# Patient Record
Sex: Female | Born: 1990 | Hispanic: No | Marital: Single | Smoking: Never smoker
Health system: Southern US, Community
[De-identification: ages and names within clinical notes are randomized; demographics above are authoritative.]

## PROBLEM LIST (undated history)

## (undated) DIAGNOSIS — R112 Nausea with vomiting, unspecified: Secondary | ICD-10-CM

## (undated) DIAGNOSIS — Z9889 Other specified postprocedural states: Secondary | ICD-10-CM

## (undated) HISTORY — PX: EYE SURGERY: SHX253

---
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
## 2010-08-18 ENCOUNTER — Encounter: Payer: Self-pay | Admitting: Family Medicine

## 2010-08-18 ENCOUNTER — Ambulatory Visit: Payer: Self-pay | Admitting: Family Medicine

## 2010-08-18 DIAGNOSIS — N760 Acute vaginitis: Secondary | ICD-10-CM | POA: Insufficient documentation

## 2010-08-18 LAB — CONVERTED CEMR LAB
CO2: 24 meq/L (ref 19–32)
Calcium: 9.5 mg/dL (ref 8.4–10.5)
Chloride: 102 meq/L (ref 96–112)
Cholesterol: 154 mg/dL (ref 0–200)
Glucose, Bld: 78 mg/dL (ref 70–99)
Hemoglobin: 13.8 g/dL (ref 12.0–15.0)
LDL Cholesterol: 98 mg/dL (ref 0–99)
MCHC: 34 g/dL (ref 30.0–36.0)
MCV: 89.4 fL (ref 78.0–100.0)
Potassium: 3.9 meq/L (ref 3.5–5.3)
RBC: 4.54 M/uL (ref 3.87–5.11)
Sodium: 137 meq/L (ref 135–145)
Total CHOL/HDL Ratio: 3.3
Triglycerides: 48 mg/dL (ref <150)
VLDL: 10 mg/dL (ref 0–40)
WBC: 6.5 10*3/uL (ref 4.0–10.5)

## 2010-08-19 ENCOUNTER — Encounter: Payer: Self-pay | Admitting: Family Medicine

## 2010-10-09 NOTE — Letter (Signed)
Summary: Generic Letter  [facility]  [address]   [place], [place] [address]   Phone: [phone]  Fax: [phone]    08/19/2010  [facility] [address] [place], [place]  [address]  Dear Ms. Stapel,   All of the blood work done at your Health Visit was normal.  The testing that was done was also all negative.   Keep up with staying healthy, and I will see you in a year unless you need me sooner.     Sincerely,   Luretha Murphy NP  Appended Document: Generic Letter letter mailed
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
## 2010-10-15 ENCOUNTER — Encounter: Payer: Self-pay | Admitting: *Deleted

## 2012-01-11 ENCOUNTER — Ambulatory Visit: Payer: Self-pay | Admitting: Family Medicine

## 2013-03-17 ENCOUNTER — Emergency Department (HOSPITAL_COMMUNITY): Payer: 59

## 2013-03-17 ENCOUNTER — Encounter (HOSPITAL_COMMUNITY): Payer: Self-pay | Admitting: *Deleted

## 2013-03-17 ENCOUNTER — Emergency Department (HOSPITAL_COMMUNITY)
Admission: EM | Admit: 2013-03-17 | Discharge: 2013-03-18 | Disposition: A | Discharge status: Home / Self Care | Payer: 59 | Attending: Emergency Medicine | Admitting: Emergency Medicine

## 2013-03-17 DIAGNOSIS — S0530XA Ocular laceration without prolapse or loss of intraocular tissue, unspecified eye, initial encounter: Secondary | ICD-10-CM | POA: Insufficient documentation

## 2013-03-17 DIAGNOSIS — W34010A Accidental discharge of airgun, initial encounter: Secondary | ICD-10-CM | POA: Insufficient documentation

## 2013-03-17 DIAGNOSIS — H2100 Hyphema, unspecified eye: Secondary | ICD-10-CM | POA: Insufficient documentation

## 2013-03-17 DIAGNOSIS — H5460 Unqualified visual loss, one eye, unspecified: Secondary | ICD-10-CM | POA: Insufficient documentation

## 2013-03-17 DIAGNOSIS — Y929 Unspecified place or not applicable: Secondary | ICD-10-CM | POA: Insufficient documentation

## 2013-03-17 DIAGNOSIS — Z23 Encounter for immunization: Secondary | ICD-10-CM | POA: Insufficient documentation

## 2013-03-17 DIAGNOSIS — H2101 Hyphema, right eye: Secondary | ICD-10-CM

## 2013-03-17 DIAGNOSIS — Y9389 Activity, other specified: Secondary | ICD-10-CM | POA: Insufficient documentation

## 2013-03-17 LAB — CBC
HCT: 38.7 % (ref 36.0–46.0)
MCHC: 34.4 g/dL (ref 30.0–36.0)
MCV: 85.6 fL (ref 78.0–100.0)
Platelets: 298 10*3/uL (ref 150–400)
RDW: 13.4 % (ref 11.5–15.5)
WBC: 8.5 10*3/uL (ref 4.0–10.5)

## 2013-03-17 LAB — BASIC METABOLIC PANEL
BUN: 7 mg/dL (ref 6–23)
Chloride: 101 mEq/L (ref 96–112)
Creatinine, Ser: 0.82 mg/dL (ref 0.50–1.10)
GFR calc Af Amer: >90 mL/min (ref >90)
GFR calc non Af Amer: >90 mL/min (ref >90)

## 2013-03-17 MED ORDER — CEFTAZIDIME 1 G IJ SOLR: 1.0000 g | Freq: Once | INTRAMUSCULAR | Status: DC

## 2013-03-17 MED ORDER — TETANUS-DIPHTH-ACELL PERTUSSIS 5-2.5-18.5 LF-MCG/0.5 IM SUSP
0.5000 mL | Freq: Once | INTRAMUSCULAR | Status: AC
  Administered 2013-03-17: 0.5 mL via INTRAMUSCULAR

## 2013-03-17 MED ORDER — DEXTROSE 5 % IV SOLN
1.0000 g | INTRAVENOUS | Status: AC
  Administered 2013-03-17: 1 g via INTRAVENOUS
  Filled 2013-03-17: qty 1

## 2013-03-17 MED ORDER — MORPHINE SULFATE 4 MG/ML IJ SOLN
4.0000 mg | Freq: Once | INTRAMUSCULAR | Status: AC
  Administered 2013-03-17: 4 mg via INTRAVENOUS
  Filled 2013-03-17: qty 1

## 2013-03-17 MED ORDER — ONDANSETRON HCL 4 MG/2ML IJ SOLN
4.0000 mg | Freq: Once | INTRAMUSCULAR | Status: AC
  Administered 2013-03-17: 4 mg via INTRAVENOUS
  Filled 2013-03-17: qty 2

## 2013-03-17 MED ORDER — TETANUS-DIPHTH-ACELL PERTUSSIS 5-2.5-18.5 LF-MCG/0.5 IM SUSP
INTRAMUSCULAR | Status: AC
  Filled 2013-03-17: qty 0.5

## 2013-03-17 NOTE — ED Notes (Signed)
Patient states she can tell if the lights are on, but unable to see anything out of eye.
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
## 2013-03-17 NOTE — ED Notes (Signed)
Patient returned from ct at this time

## 2013-03-17 NOTE — ED Notes (Signed)
Visual acquity lt eye 20/30.  Rt eye o  Light only.  No objects seen

## 2013-03-17 NOTE — ED Notes (Signed)
Patient to CT at this time

## 2013-03-18 ENCOUNTER — Encounter: Payer: Self-pay | Admitting: Ophthalmology

## 2013-03-18 MED ORDER — METHAZOLAMIDE 50 MG PO TABS
50.0000 mg | ORAL_TABLET | Freq: Once | ORAL | Status: AC
  Administered 2013-03-18: 50 mg via ORAL
  Filled 2013-03-18: qty 1

## 2013-03-18 MED ORDER — PREDNISOLONE ACETATE 1 % OP SUSP
1.0000 [drp] | Freq: Once | OPHTHALMIC | Status: AC
  Administered 2013-03-18: 1 [drp] via OPHTHALMIC
  Filled 2013-03-18: qty 1

## 2013-03-18 MED ORDER — ATROPINE SULFATE 1 % OP SOLN
1.0000 [drp] | Freq: Once | OPHTHALMIC | Status: AC
  Administered 2013-03-18: 1 [drp] via OPHTHALMIC
  Filled 2013-03-18: qty 2

## 2013-03-18 MED ORDER — FLUORESCEIN SODIUM 1 MG OP STRP
ORAL_STRIP | OPHTHALMIC | Status: AC
  Filled 2013-03-18: qty 1

## 2013-03-18 MED ORDER — TOBRAMYCIN-DEXAMETHASONE 0.3-0.1 % OP OINT
TOPICAL_OINTMENT | Freq: Once | OPHTHALMIC | Status: AC
  Administered 2013-03-18: 01:00:00 via OPHTHALMIC

## 2013-03-18 MED ORDER — TIMOLOL MALEATE 0.5 % OP SOLN
1.0000 [drp] | Freq: Two times a day (BID) | OPHTHALMIC | Status: DC
  Administered 2013-03-18: 1 [drp] via OPHTHALMIC
  Filled 2013-03-18: qty 5

## 2013-03-18 MED ORDER — TETRACAINE HCL 0.5 % OP SOLN
2.0000 [drp] | Freq: Once | OPHTHALMIC | Status: AC
  Administered 2013-03-18: 2 [drp] via OPHTHALMIC
  Filled 2013-03-18: qty 2

## 2013-03-18 MED ORDER — PREDNISONE 20 MG PO TABS
60.0000 mg | ORAL_TABLET | Freq: Once | ORAL | Status: AC
  Administered 2013-03-18: 60 mg via ORAL
  Filled 2013-03-18: qty 3

## 2013-03-18 MED ORDER — FLUORESCEIN SODIUM 1 MG OP STRP
1.0000 | ORAL_STRIP | Freq: Once | OPHTHALMIC | Status: AC
  Administered 2013-03-18: 01:00:00 via OPHTHALMIC

## 2013-03-18 NOTE — Progress Notes (Signed)
Patient ID: Linda Benson, female   DOB: [birth year], 22 y.o.   MRN: [id number]

## 2013-03-18 NOTE — ED Notes (Signed)
Tobradex, Pred Forte, Atropine and Timoptic given by Dr Karleen Hampshire.
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
## 2013-03-18 NOTE — ED Notes (Signed)
Patient taken to eye room with Dr Karleen Hampshire.

## 2015-09-26 ENCOUNTER — Ambulatory Visit: Payer: Self-pay | Admitting: Podiatry

## 2018-04-03 ENCOUNTER — Emergency Department (HOSPITAL_COMMUNITY): Payer: Self-pay

## 2018-04-03 ENCOUNTER — Emergency Department (HOSPITAL_COMMUNITY)
Admission: EM | Admit: 2018-04-03 | Discharge: 2018-04-03 | Disposition: A | Discharge status: Home / Self Care | Payer: Self-pay | Attending: Emergency Medicine | Admitting: Emergency Medicine

## 2018-04-03 ENCOUNTER — Encounter (HOSPITAL_COMMUNITY): Payer: Self-pay | Admitting: Emergency Medicine

## 2018-04-03 DIAGNOSIS — Y9383 Activity, rough housing and horseplay: Secondary | ICD-10-CM | POA: Insufficient documentation

## 2018-04-03 DIAGNOSIS — Y929 Unspecified place or not applicable: Secondary | ICD-10-CM | POA: Insufficient documentation

## 2018-04-03 DIAGNOSIS — T148XXA Other injury of unspecified body region, initial encounter: Secondary | ICD-10-CM

## 2018-04-03 DIAGNOSIS — S9305XA Dislocation of left ankle joint, initial encounter: Secondary | ICD-10-CM | POA: Insufficient documentation

## 2018-04-03 DIAGNOSIS — S82899A Other fracture of unspecified lower leg, initial encounter for closed fracture: Secondary | ICD-10-CM

## 2018-04-03 DIAGNOSIS — S8252XA Displaced fracture of medial malleolus of left tibia, initial encounter for closed fracture: Secondary | ICD-10-CM | POA: Insufficient documentation

## 2018-04-03 DIAGNOSIS — W500XXA Accidental hit or strike by another person, initial encounter: Secondary | ICD-10-CM | POA: Insufficient documentation

## 2018-04-03 DIAGNOSIS — Y999 Unspecified external cause status: Secondary | ICD-10-CM | POA: Insufficient documentation

## 2018-04-03 MED ORDER — MIDAZOLAM HCL 2 MG/2ML IJ SOLN
INTRAMUSCULAR | Status: AC | PRN
  Administered 2018-04-03: 2 mg via INTRAVENOUS

## 2018-04-03 MED ORDER — ONDANSETRON HCL 4 MG/2ML IJ SOLN
4.0000 mg | Freq: Once | INTRAMUSCULAR | Status: DC
  Filled 2018-04-03: qty 2

## 2018-04-03 MED ORDER — HYDROCODONE-ACETAMINOPHEN 5-325 MG PO TABS: 1.0000 | ORAL_TABLET | ORAL | 0 refills | Status: DC | PRN

## 2018-04-03 MED ORDER — KETAMINE HCL 10 MG/ML IJ SOLN
INTRAMUSCULAR | Status: AC | PRN
  Administered 2018-04-03: 91 mg via INTRAVENOUS

## 2018-04-03 MED ORDER — KETAMINE HCL 50 MG/5ML IJ SOSY
1.0000 mg/kg | PREFILLED_SYRINGE | Freq: Once | INTRAMUSCULAR | Status: AC
  Administered 2018-04-03: 91 mg via INTRAVENOUS
  Filled 2018-04-03: qty 10

## 2018-04-03 MED ORDER — ONDANSETRON 4 MG PO TBDP
ORAL_TABLET | ORAL | Status: AC
  Filled 2018-04-03: qty 1

## 2018-04-03 MED ORDER — MIDAZOLAM HCL 2 MG/2ML IJ SOLN
2.0000 mg | Freq: Once | INTRAMUSCULAR | Status: AC
  Administered 2018-04-03: 2 mg via INTRAVENOUS
  Filled 2018-04-03: qty 2

## 2018-04-03 MED ORDER — ONDANSETRON 4 MG PO TBDP
4.0000 mg | ORAL_TABLET | Freq: Once | ORAL | Status: AC
Start: 2018-04-03 — End: 2018-04-03
  Administered 2018-04-03: 4 mg via ORAL

## 2018-04-03 NOTE — Progress Notes (Signed)
Orthopedic Tech Progress Note Patient Details:  Linda RoundsStephanie N Benson December 16, 1990 [id number]  Ortho Devices Type of Ortho Device: Post (short leg) splint, Stirrup splint Ortho Device/Splint Location: lle Ortho Device/Splint Interventions: Ordered, Application, Adjustment   Post Interventions Patient Tolerated: Well Instructions Provided: Care of device, Adjustment of device   [name], Rihan Schueler J 04/03/2018, 8:16 PM

## 2018-04-03 NOTE — ED Notes (Signed)
ICE PACK TO FOOT  CAP REFILL GOOD

## 2018-04-03 NOTE — ED Notes (Signed)
pts vitals baCK TO NORMAL  PT TALKING TO PARENTS AT PRESENT

## 2018-04-03 NOTE — Discharge Instructions (Addendum)
Do not bear weight on your left leg.  Call tomorrow morning to make an appointment to follow-up with the orthopedist.  Keep leg elevated to help reduce swelling.  Take medication as prescribed.
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
## 2018-04-03 NOTE — ED Notes (Signed)
Parents at the bedside.
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
## 2018-04-04 NOTE — Progress Notes (Signed)
Orthopedic Tech Progress Note Patient Details:  Linda Benson [birth year] [id number]  Ortho Devices Type of Ortho Device: Crutches Ortho Device/Splint Location: lle Ortho Device/Splint Interventions: Ordered, Application, Adjustment   Post Interventions Patient Tolerated: Well Instructions Provided: Care of device, Adjustment of device   [name], Aunya Lemler J 04/04/2018, 12:18 AM

## 2018-04-13 ENCOUNTER — Encounter (HOSPITAL_COMMUNITY): Payer: Self-pay | Admitting: *Deleted

## 2018-04-13 ENCOUNTER — Other Ambulatory Visit: Payer: Self-pay

## 2018-04-13 NOTE — Progress Notes (Signed)
Spoke with pt for pre-op call. Pt denies cardiac history, HTN or diabetes.  Pt instructed to hold Ibuprofen as of today prior to surgery.

## 2018-04-15 ENCOUNTER — Encounter (HOSPITAL_COMMUNITY)
Admission: RE | Disposition: A | Discharge status: Home / Self Care | Payer: Self-pay | Source: Ambulatory Visit | Attending: Orthopedic Surgery

## 2018-04-15 ENCOUNTER — Ambulatory Visit (HOSPITAL_COMMUNITY): Payer: BLUE CROSS/BLUE SHIELD

## 2018-04-15 ENCOUNTER — Ambulatory Visit (HOSPITAL_COMMUNITY)
Admission: RE | Admit: 2018-04-15 | Discharge: 2018-04-15 | Disposition: A | Discharge status: Home / Self Care | Payer: BLUE CROSS/BLUE SHIELD | Source: Ambulatory Visit | Attending: Orthopedic Surgery | Admitting: Orthopedic Surgery

## 2018-04-15 ENCOUNTER — Ambulatory Visit (HOSPITAL_COMMUNITY): Payer: BLUE CROSS/BLUE SHIELD | Admitting: Anesthesiology

## 2018-04-15 ENCOUNTER — Encounter (HOSPITAL_COMMUNITY): Payer: Self-pay | Admitting: *Deleted

## 2018-04-15 DIAGNOSIS — T1490XA Injury, unspecified, initial encounter: Secondary | ICD-10-CM

## 2018-04-15 DIAGNOSIS — Z8249 Family history of ischemic heart disease and other diseases of the circulatory system: Secondary | ICD-10-CM | POA: Diagnosis not present

## 2018-04-15 DIAGNOSIS — Y939 Activity, unspecified: Secondary | ICD-10-CM | POA: Diagnosis not present

## 2018-04-15 DIAGNOSIS — L700 Acne vulgaris: Secondary | ICD-10-CM | POA: Insufficient documentation

## 2018-04-15 DIAGNOSIS — Z6836 Body mass index (BMI) 36.0-36.9, adult: Secondary | ICD-10-CM | POA: Diagnosis not present

## 2018-04-15 DIAGNOSIS — S93432A Sprain of tibiofibular ligament of left ankle, initial encounter: Secondary | ICD-10-CM | POA: Diagnosis not present

## 2018-04-15 DIAGNOSIS — S82842A Displaced bimalleolar fracture of left lower leg, initial encounter for closed fracture: Secondary | ICD-10-CM | POA: Insufficient documentation

## 2018-04-15 DIAGNOSIS — W19XXXA Unspecified fall, initial encounter: Secondary | ICD-10-CM | POA: Diagnosis not present

## 2018-04-15 DIAGNOSIS — N760 Acute vaginitis: Secondary | ICD-10-CM | POA: Diagnosis not present

## 2018-04-15 DIAGNOSIS — E669 Obesity, unspecified: Secondary | ICD-10-CM | POA: Diagnosis not present

## 2018-04-15 DIAGNOSIS — S82892A Other fracture of left lower leg, initial encounter for closed fracture: Secondary | ICD-10-CM | POA: Diagnosis present

## 2018-04-15 DIAGNOSIS — Z8261 Family history of arthritis: Secondary | ICD-10-CM | POA: Insufficient documentation

## 2018-04-15 DIAGNOSIS — R51 Headache: Secondary | ICD-10-CM | POA: Insufficient documentation

## 2018-04-15 HISTORY — DX: Nausea with vomiting, unspecified: R11.2

## 2018-04-15 HISTORY — PX: ORIF ANKLE FRACTURE: SHX5408

## 2018-04-15 HISTORY — DX: Other specified postprocedural states: Z98.890

## 2018-04-15 LAB — HEMOGLOBIN: HEMOGLOBIN: 13.4 g/dL (ref 12.0–15.0)

## 2018-04-15 LAB — POCT PREGNANCY, URINE: PREG TEST UR: NEGATIVE

## 2018-04-15 SURGERY — OPEN REDUCTION INTERNAL FIXATION (ORIF) ANKLE FRACTURE
Anesthesia: General | Site: Ankle | Laterality: Left

## 2018-04-15 MED ORDER — LIDOCAINE 2% (20 MG/ML) 5 ML SYRINGE
INTRAMUSCULAR | Status: AC
  Filled 2018-04-15: qty 5

## 2018-04-15 MED ORDER — 0.9 % SODIUM CHLORIDE (POUR BTL) OPTIME
TOPICAL | Status: DC | PRN
  Administered 2018-04-15: 1000 mL

## 2018-04-15 MED ORDER — PROPOFOL 10 MG/ML IV BOLUS
INTRAVENOUS | Status: DC | PRN
  Administered 2018-04-15: 120 mg via INTRAVENOUS

## 2018-04-15 MED ORDER — DEXAMETHASONE SODIUM PHOSPHATE 4 MG/ML IJ SOLN
INTRAMUSCULAR | Status: DC | PRN
  Administered 2018-04-15: 10 mg via INTRAVENOUS

## 2018-04-15 MED ORDER — FENTANYL CITRATE (PF) 100 MCG/2ML IJ SOLN
25.0000 ug | INTRAMUSCULAR | Status: DC | PRN
  Administered 2018-04-15: 25 ug via INTRAVENOUS

## 2018-04-15 MED ORDER — OXYCODONE HCL 5 MG PO TABS: 5.0000 mg | ORAL_TABLET | ORAL | 0 refills | Status: AC | PRN

## 2018-04-15 MED ORDER — OXYCODONE HCL 5 MG/5ML PO SOLN: 5.0000 mg | Freq: Once | ORAL | Status: AC | PRN

## 2018-04-15 MED ORDER — ONDANSETRON HCL 4 MG/2ML IJ SOLN
INTRAMUSCULAR | Status: DC | PRN
  Administered 2018-04-15: 4 mg via INTRAVENOUS

## 2018-04-15 MED ORDER — MEPERIDINE HCL 50 MG/ML IJ SOLN: 6.2500 mg | INTRAMUSCULAR | Status: DC | PRN

## 2018-04-15 MED ORDER — CHLORHEXIDINE GLUCONATE 4 % EX LIQD: 60.0000 mL | Freq: Once | CUTANEOUS | Status: DC

## 2018-04-15 MED ORDER — FENTANYL CITRATE (PF) 100 MCG/2ML IJ SOLN
INTRAMUSCULAR | Status: DC | PRN
  Administered 2018-04-15: 200 ug via INTRAVENOUS

## 2018-04-15 MED ORDER — ACETAMINOPHEN 160 MG/5ML PO SOLN: 325.0000 mg | ORAL | Status: DC | PRN

## 2018-04-15 MED ORDER — CEFAZOLIN SODIUM-DEXTROSE 2-4 GM/100ML-% IV SOLN
2.0000 g | INTRAVENOUS | Status: AC
  Administered 2018-04-15: 2 g via INTRAVENOUS
  Filled 2018-04-15: qty 100

## 2018-04-15 MED ORDER — BUPIVACAINE HCL (PF) 0.25 % IJ SOLN
INTRAMUSCULAR | Status: AC
  Filled 2018-04-15: qty 30

## 2018-04-15 MED ORDER — ONDANSETRON HCL 4 MG/2ML IJ SOLN: 4.0000 mg | Freq: Once | INTRAMUSCULAR | Status: DC | PRN

## 2018-04-15 MED ORDER — MIDAZOLAM HCL 2 MG/2ML IJ SOLN
INTRAMUSCULAR | Status: AC
  Administered 2018-04-15: 2 mg
  Filled 2018-04-15: qty 2

## 2018-04-15 MED ORDER — ROPIVACAINE HCL 7.5 MG/ML IJ SOLN
INTRAMUSCULAR | Status: DC | PRN
  Administered 2018-04-15: 25 mL via PERINEURAL

## 2018-04-15 MED ORDER — FENTANYL CITRATE (PF) 100 MCG/2ML IJ SOLN
INTRAMUSCULAR | Status: AC
  Administered 2018-04-15: 25 ug via INTRAVENOUS
  Filled 2018-04-15: qty 2

## 2018-04-15 MED ORDER — ONDANSETRON 4 MG PO TBDP: 4.0000 mg | ORAL_TABLET | Freq: Three times a day (TID) | ORAL | 0 refills | Status: AC | PRN

## 2018-04-15 MED ORDER — CEFAZOLIN SODIUM-DEXTROSE 2-4 GM/100ML-% IV SOLN
INTRAVENOUS | Status: AC
  Filled 2018-04-15: qty 100

## 2018-04-15 MED ORDER — FENTANYL CITRATE (PF) 250 MCG/5ML IJ SOLN
INTRAMUSCULAR | Status: AC
  Filled 2018-04-15: qty 5

## 2018-04-15 MED ORDER — OXYCODONE HCL 5 MG PO TABS
5.0000 mg | ORAL_TABLET | Freq: Once | ORAL | Status: AC | PRN
  Administered 2018-04-15: 5 mg via ORAL

## 2018-04-15 MED ORDER — DEXAMETHASONE SODIUM PHOSPHATE 10 MG/ML IJ SOLN
INTRAMUSCULAR | Status: DC | PRN
  Administered 2018-04-15: 10 mg via INTRAVENOUS

## 2018-04-15 MED ORDER — ROCURONIUM BROMIDE 10 MG/ML (PF) SYRINGE
PREFILLED_SYRINGE | INTRAVENOUS | Status: AC
  Filled 2018-04-15: qty 10

## 2018-04-15 MED ORDER — OXYCODONE HCL 5 MG PO TABS
ORAL_TABLET | ORAL | Status: AC
  Administered 2018-04-15: 5 mg via ORAL
  Filled 2018-04-15: qty 1

## 2018-04-15 MED ORDER — ACETAMINOPHEN 325 MG PO TABS: 325.0000 mg | ORAL_TABLET | ORAL | Status: DC | PRN

## 2018-04-15 MED ORDER — DEXAMETHASONE SODIUM PHOSPHATE 10 MG/ML IJ SOLN
INTRAMUSCULAR | Status: AC
  Filled 2018-04-15: qty 1

## 2018-04-15 MED ORDER — ROCURONIUM BROMIDE 100 MG/10ML IV SOLN
INTRAVENOUS | Status: DC | PRN
  Administered 2018-04-15: 50 mg via INTRAVENOUS

## 2018-04-15 MED ORDER — ONDANSETRON HCL 4 MG/2ML IJ SOLN
INTRAMUSCULAR | Status: AC
  Filled 2018-04-15: qty 2

## 2018-04-15 MED ORDER — LACTATED RINGERS IV SOLN
INTRAVENOUS | Status: DC
  Administered 2018-04-15: 50 mL/h via INTRAVENOUS

## 2018-04-15 MED ORDER — SUGAMMADEX SODIUM 200 MG/2ML IV SOLN
INTRAVENOUS | Status: DC | PRN
  Administered 2018-04-15: 200 mg via INTRAVENOUS

## 2018-04-15 MED ORDER — PROPOFOL 10 MG/ML IV BOLUS
INTRAVENOUS | Status: AC
  Filled 2018-04-15: qty 20

## 2018-04-15 MED ORDER — FENTANYL CITRATE (PF) 100 MCG/2ML IJ SOLN
INTRAMUSCULAR | Status: AC
  Administered 2018-04-15: 100 ug
  Filled 2018-04-15: qty 2

## 2018-04-15 SURGICAL SUPPLY — 59 items
ALCOHOL 70% 16 OZ (MISCELLANEOUS) IMPLANT
BANDAGE ACE 4X5 VEL STRL LF (GAUZE/BANDAGES/DRESSINGS) ×3 IMPLANT
BANDAGE ACE 6X5 VEL STRL LF (GAUZE/BANDAGES/DRESSINGS) ×3 IMPLANT
BANDAGE ESMARK 6X9 LF (GAUZE/BANDAGES/DRESSINGS) IMPLANT
BIT DRILL 2.5 CANN STRL (BIT) ×3 IMPLANT
BIT DRILL 2.6 CANN (BIT) ×3 IMPLANT
BNDG COHESIVE 4X5 TAN STRL (GAUZE/BANDAGES/DRESSINGS) ×3 IMPLANT
BNDG ESMARK 6X9 LF (GAUZE/BANDAGES/DRESSINGS)
CANISTER SUCT 3000ML PPV (MISCELLANEOUS) ×3 IMPLANT
COVER SURGICAL LIGHT HANDLE (MISCELLANEOUS) ×3 IMPLANT
CUFF TOURNIQUET SINGLE 34IN LL (TOURNIQUET CUFF) ×3 IMPLANT
DRAPE C-ARM 42X72 X-RAY (DRAPES) ×3 IMPLANT
DRAPE C-ARMOR (DRAPES) ×3 IMPLANT
DRAPE U-SHAPE 47X51 STRL (DRAPES) ×3 IMPLANT
DRSG ADAPTIC 3X8 NADH LF (GAUZE/BANDAGES/DRESSINGS) ×3 IMPLANT
DRSG PAD ABDOMINAL 8X10 ST (GAUZE/BANDAGES/DRESSINGS) ×6 IMPLANT
DURAPREP 26ML APPLICATOR (WOUND CARE) ×6 IMPLANT
ELECT REM PT RETURN 9FT ADLT (ELECTROSURGICAL) ×3
ELECTRODE REM PT RTRN 9FT ADLT (ELECTROSURGICAL) ×1 IMPLANT
GAUZE SPONGE 4X4 12PLY STRL (GAUZE/BANDAGES/DRESSINGS) ×3 IMPLANT
GLOVE BIO SURGEON STRL SZ7.5 (GLOVE) ×3 IMPLANT
GLOVE BIOGEL PI IND STRL 8 (GLOVE) ×1 IMPLANT
GLOVE BIOGEL PI INDICATOR 8 (GLOVE) ×2
GOWN STRL REUS W/ TWL LRG LVL3 (GOWN DISPOSABLE) ×2 IMPLANT
GOWN STRL REUS W/ TWL XL LVL3 (GOWN DISPOSABLE) ×1 IMPLANT
GOWN STRL REUS W/TWL LRG LVL3 (GOWN DISPOSABLE) ×4
GOWN STRL REUS W/TWL XL LVL3 (GOWN DISPOSABLE) ×2
GUIDEWIRE 1.35MM (WIRE) ×3 IMPLANT
IMPL TIGHTROP W/DRV K-LESS (Anchor) ×2 IMPLANT
IMPLANT TIGHTROPE W/DRV K-LESS (Anchor) ×6 IMPLANT
K-WIRE BB-TAK (WIRE) ×3
KIT BASIN OR (CUSTOM PROCEDURE TRAY) ×3 IMPLANT
KIT TURNOVER KIT B (KITS) ×3 IMPLANT
KWIRE BB-TAK (WIRE) ×1 IMPLANT
NEEDLE HYPO 25GX1X1/2 BEV (NEEDLE) IMPLANT
NS IRRIG 1000ML POUR BTL (IV SOLUTION) ×3 IMPLANT
PACK ORTHO EXTREMITY (CUSTOM PROCEDURE TRAY) ×3 IMPLANT
PAD ARMBOARD 7.5X6 YLW CONV (MISCELLANEOUS) ×6 IMPLANT
PAD CAST 4YDX4 CTTN HI CHSV (CAST SUPPLIES) ×2 IMPLANT
PADDING CAST COTTON 4X4 STRL (CAST SUPPLIES) ×4
PADDING CAST COTTON 6X4 STRL (CAST SUPPLIES) ×3 IMPLANT
PLATE 2 HOLE SS STRL (Plate) ×3 IMPLANT
PLATE LOCKING TUBULAR 4H (Plate) ×3 IMPLANT
SCREW CORT 3.5X40 LP ANKLE (Screw) ×3 IMPLANT
SCREW LOW PROFILE 4.0X40 (Screw) ×3 IMPLANT
SCREW NL STEEL CORT 3.5X34 (Screw) ×3 IMPLANT
SPONGE LAP 18X18 X RAY DECT (DISPOSABLE) IMPLANT
SUCTION FRAZIER HANDLE 10FR (MISCELLANEOUS) ×2
SUCTION TUBE FRAZIER 10FR DISP (MISCELLANEOUS) ×1 IMPLANT
SUT ETHILON 3 0 PS 1 (SUTURE) ×3 IMPLANT
SUT VIC AB 0 CT1 27 (SUTURE)
SUT VIC AB 0 CT1 27XBRD ANBCTR (SUTURE) IMPLANT
SUT VIC AB 2-0 CT1 27 (SUTURE) ×2
SUT VIC AB 2-0 CT1 TAPERPNT 27 (SUTURE) ×1 IMPLANT
SYR CONTROL 10ML LL (SYRINGE) IMPLANT
TOWEL OR 17X26 10 PK STRL BLUE (TOWEL DISPOSABLE) ×3 IMPLANT
TUBE CONNECTING 12'X1/4 (SUCTIONS) ×1
TUBE CONNECTING 12X1/4 (SUCTIONS) ×2 IMPLANT
YANKAUER SUCT BULB TIP NO VENT (SUCTIONS) ×3 IMPLANT

## 2018-04-15 NOTE — Anesthesia Postprocedure Evaluation (Signed)
Anesthesia Post Note  Patient: Kennon RoundsStephanie N Sobh  Procedure(s) Performed: OPEN REDUCTION INTERNAL FIXATION (ORIF) ANKLE FRACTURE WITH SYNDESMOTIC FIXATION (Left Ankle)     Patient location during evaluation: PACU Anesthesia Type: General Level of consciousness: awake and alert Pain management: pain level controlled Vital Signs Assessment: post-procedure vital signs reviewed and stable Respiratory status: spontaneous breathing, nonlabored ventilation and respiratory function stable Cardiovascular status: blood pressure returned to baseline and stable Postop Assessment: no apparent nausea or vomiting Anesthetic complications: no    Last Vitals:  Vitals:   04/15/18 1630 04/15/18 1645  BP: 107/65 117/74  Pulse: 95 81  Resp: 16 20  Temp: (!) 36.4 C   SpO2: 98% 100%    Last Pain:  Vitals:   04/15/18 1630  TempSrc:   PainSc: 5                  Beryle Lathehomas E Brock

## 2018-04-15 NOTE — Brief Op Note (Signed)
04/15/2018  3:07 PM  PATIENT:  Linda Benson  27 y.o. female  PRE-OPERATIVE DIAGNOSIS:  Left trimalleolar ankle fracture  POST-OPERATIVE DIAGNOSIS:  Left trimalleolar ankle fracture  PROCEDURE:  Procedure(s): OPEN REDUCTION INTERNAL FIXATION (ORIF) ANKLE FRACTURE WITH SYNDESMOTIC FIXATION (Left)  SURGEON:  Surgeon(s) and Role:    Yolonda Kida* Rogers, Jason Patrick, MD - Primary  PHYSICIAN ASSISTANT:   ASSISTANTS: April Green, RNFA   ANESTHESIA:   general and regional  EBL:  25 mL   BLOOD ADMINISTERED:none  DRAINS: none   LOCAL MEDICATIONS USED:  NONE  SPECIMEN:  No Specimen  DISPOSITION OF SPECIMEN:  N/A  COUNTS:  YES  TOURNIQUET:   Total Tourniquet Time Documented: Thigh (Left) - 46 minutes Total: Thigh (Left) - 46 minutes   DICTATION: .Note written in EPIC  PLAN OF CARE: Discharge to home after PACU  PATIENT DISPOSITION:  PACU - hemodynamically stable.   Delay start of Pharmacological VTE agent (>24hrs) due to surgical blood loss or risk of bleeding: not applicable

## 2018-04-15 NOTE — Transfer of Care (Signed)
Immediate Anesthesia Transfer of Care Note  Patient: Kennon RoundsStephanie N Pfalzgraf  Procedure(s) Performed: OPEN REDUCTION INTERNAL FIXATION (ORIF) ANKLE FRACTURE WITH SYNDESMOTIC FIXATION (Left Ankle)  Patient Location: PACU  Anesthesia Type:General and GA combined with regional for post-op pain  Level of Consciousness: awake, alert , oriented and patient cooperative  Airway & Oxygen Therapy: Patient Spontanous Breathing and Patient connected to nasal cannula oxygen  Post-op Assessment: Report given to RN and Post -op Vital signs reviewed and stable  Post vital signs: Reviewed and stable  Last Vitals:  Vitals Value Taken Time  BP 97/57 04/15/2018  3:05 PM  Temp    Pulse 100 04/15/2018  3:09 PM  Resp 30 04/15/2018  3:09 PM  SpO2 100 % 04/15/2018  3:09 PM  Vitals shown include unvalidated device data.  Last Pain:  Vitals:   04/15/18 1134  TempSrc: Oral         Complications: No apparent anesthesia complications

## 2018-04-15 NOTE — Anesthesia Procedure Notes (Addendum)
Anesthesia Regional Block: Adductor canal block   Pre-Anesthetic Checklist: ,, timeout performed, Correct Patient, Correct Site, Correct Laterality, Correct Procedure, Correct Position, site marked, Risks and benefits discussed,  Surgical consent,  Pre-op evaluation,  At surgeon's request and post-op pain management  Laterality: Left  Prep: chloraprep       Needles:  Injection technique: Single-shot  Needle Type: Echogenic Stimulator Needle     Needle Length: 5cm  Needle Gauge: 22     Additional Needles:   Procedures:, nerve stimulator,,, ultrasound used (permanent image in chart),,,,   Nerve Stimulator or Paresthesia:  Response: gastroc, 0.45 mA,   Additional Responses:   Narrative:  Start time: 04/15/2018 1:15 PM End time: 04/15/2018 1:20 PM Injection made incrementally with aspirations every 5 mL.  Performed by: Personally  Anesthesiologist: Bethena Midgetddono, Posey Jasmin, MD  Additional Notes: Functioning IV was confirmed and monitors were applied.  A 50mm 22ga Arrow echogenic stimulator needle was used. Sterile prep and drape,hand hygiene and sterile gloves were used. Ultrasound guidance: relevant anatomy identified, needle position confirmed, local anesthetic spread visualized around nerve(s)., vascular puncture avoided.  Image printed for medical record. Negative aspiration and negative test dose prior to incremental administration of local anesthetic. The patient tolerated the procedure well.

## 2018-04-15 NOTE — Anesthesia Preprocedure Evaluation (Addendum)
Anesthesia Evaluation  Patient identified by MRN, date of birth, ID band Patient awake    Reviewed: Allergy & Precautions, H&P , NPO status , Patient's Chart, lab work & pertinent test results, reviewed documented beta blocker date and time   History of Anesthesia Complications (+) PONV and history of anesthetic complications  Airway Mallampati: II  TM Distance: >3 FB Neck ROM: full    Dental no notable dental hx.    Pulmonary neg pulmonary ROS,    Pulmonary exam normal breath sounds clear to auscultation       Cardiovascular Exercise Tolerance: Good negative cardio ROS   Rhythm:regular Rate:Normal     Neuro/Psych negative neurological ROS  negative psych ROS   GI/Hepatic negative GI ROS, Neg liver ROS,   Endo/Other  negative endocrine ROS  Renal/GU negative Renal ROS  negative genitourinary   Musculoskeletal   Abdominal   Peds  Hematology negative hematology ROS (+)   Anesthesia Other Findings OBESITY, NOS SORE THROAT ACNE VULGARIS, FACIAL DYSPLASIA, ECTODERMAL, CONGENITAL SYMPTOM, HEADACHE VULVOVAGINITIS    Reproductive/Obstetrics negative OB ROS                             Anesthesia Physical Anesthesia Plan  ASA: II  Anesthesia Plan: General   Post-op Pain Management: GA combined w/ Regional for post-op pain   Induction:   PONV Risk Score and Plan: 3 and Ondansetron, Treatment may vary due to age or medical condition, Dexamethasone, Midazolam and Scopolamine patch - Pre-op  Airway Management Planned: LMA and Oral ETT  Additional Equipment:   Intra-op Plan:   Post-operative Plan:   Informed Consent: I have reviewed the patients History and Physical, chart, labs and discussed the procedure including the risks, benefits and alternatives for the proposed anesthesia with the patient or authorized representative who has indicated his/her understanding and acceptance.    Dental Advisory Given  Plan Discussed with: CRNA, Anesthesiologist and Surgeon  Anesthesia Plan Comments:        Anesthesia Quick Evaluation

## 2018-04-15 NOTE — Op Note (Signed)
Date of Surgery: 04/15/2018  INDICATIONS: Ms. Alto DenverHunt is a 27 y.o.-year-old female who sustained a left ankle fracture; she was indicated for open reduction and internal fixation due to the displaced nature of the articular fracture and came to the operating room today for this procedure. The patient did consent to the procedure after discussion of the risks and benefits.  She percent to the emergency department initially where closed reduction was performed.  She was indicated for open reduction internal fixation due to the unstable nature of the fracture.  We discussed at length the risk of bleeding, infection, to surrounding neurovascular structures, malunion, nonunion, hardware failure, need for further surgery, developing blood clots, and the risk of anesthesia.  She did provide informed consent to proceed.  PREOPERATIVE DIAGNOSIS: left bimalleolar ankle fracture  POSTOPERATIVE DIAGNOSIS: Same.  PROCEDURE:  1. Open treatment of left ankle fracture with internal fixation Medial malleolar CPT [phone] 2. Open reduction and internal fixation of distal tibiofibular disruption with internal hardware.  SURGEON: Talea Manges P. Aundria Rudogers, M.D.  ASSIST: April Chilton SiGreen, RNFA.  ANESTHESIA:  general, and a regional block  TOURNIQUET TIME: 42 minutes  IV FLUIDS AND URINE: See anesthesia.  ESTIMATED BLOOD LOSS: minimal  IMPLANTS:  Arthrex one third tubular plate with 3.5 mm nonlocking screw and one 4.0 mm cancellus cannulated screw Arthrex tight rope fixation 2. Arthrex 2 hole plate.  COMPLICATIONS: None.  DESCRIPTION OF PROCEDURE: The patient was brought to the operating room and placed supine on the operating table.  The patient had been signed prior to the procedure and this was documented. The patient had the anesthesia placed by the anesthesiologist.  A nonsterile tourniquet was placed on the upper thigh.  The prep verification and incision time-outs were performed to confirm that this was the correct  patient, site, side and location. The patient had an SCD on the opposite lower extremity. The patient did receive antibiotics prior to the incision and was re-dosed during the procedure as needed at indicated intervals.  The patient had the lower extremity prepped and draped in the standard surgical fashion.  The extremity was exsanguinated using an esmarch bandage and the tourniquet was inflated to 300 mm Hg.    I then turned my attention to the medial malleolus. Incision was made over the medial malleolus and the fracture exposed and held provisionally with a clamp. The fracture was a very vertical fracture and not amenable to independent screw fixation.  Therefore we elected to place one transverse screw perpendicular to the fracture with good compression.  This was a 4.0 mm cannulated cancellus screw.  We also applied a buttress plate medially.  This was a one third tubular malleable plate.  After achieving good compression through the distal transverse screw within secured the plate in antiglide fashion proximally with 2 more 3.5 millimeter screws.  We did bend the distal tip of the plate to try to contour to the medial malleolus.  We checked on AP and lateral fluoroscopy to confirm adequacy of the plate placement and reduction.  The syndesmosis was stressed using live fluoroscopy and found to be unstable as indicated by widening of the medial clear space.  We then moved to stabilize the distal tibiofibular syndesmosis joint.  We elevated the heel on a stack of sterile towels.  We then dissected down to the lateral malleolus proximally 2-4 cm proximal to the tibial plafond.  We made a subperiosteal dissection at that location for a 2 hole Arthrex plate.  We then planned  for soon as much fixation with 2 Arthrex tight ropes.  Each tightrope was placed in a similar fashion by first drilling parallel to the plafond and a 4 cortex drill passed.  This was confirmed on radiography.  The tightrope button was  then passed across to the medial cortex of the tibia.  The button was then flipped and tensioned with the foot in neutral dorsiflexion and the heel elevated off the table.  Both tightrope had excellent fixation and were all noted to be joint with the Endobutton flipped appropriately on the medial tibial cortex.  The wounds were irrigated, and closed with vicryl with routine closure for the skin. The wounds were injected with local anesthetic. Sterile gauze was applied followed by a posterior splint. She was awakened and returned to the PACU in stable and satisfactory condition. There were no complications.  All counts were correct 2.  There were no locations and she was transferred to PACU in stable condition.  POSTOPERATIVE PLAN: Ms. Nilsen will remain nonweightbearing on this leg for approximately 8 weeks; Ms. Hunsberger will return for suture removal in 2 weeks.  He will be immobilized in a short leg splint and then transitioned to a CAM walker at his first follow up appointment.  Ms. Campa will receive DVT prophylaxis based on other medications, activity level, and risk ratio of bleeding to thrombosis.  Maryan Rued, MD [facility] [phone] 3:13 PM

## 2018-04-15 NOTE — H&P (Signed)
ORTHOPAEDIC H and P  REQUESTING PHYSICIAN: Yolonda Kida, MD  PCP:  Patient, No Pcp Per  Chief Complaint: Left ankle fracture  HPI: Linda Benson is a 27 y.o. female who complains of left ankle pain in addition to left ankle fracture following a fall prior to today's presentation.  She did follow-up in my office following a closed reduction procedure in the emergency department for a bimalleolar equivalent fracture.  She is indicated for operative fixation and presents today for that surgery.  No new complaints at this time.  No new questions.  Past Medical History:  Diagnosis Date  . PONV (postoperative nausea and vomiting)    Past Surgical History:  Procedure Laterality Date  . EYE SURGERY     cataract, macular hole surgery, glaucoma - 3 different surgeries   Social History   Socioeconomic History  . Marital status: Single    Spouse name: Not on file  . Number of children: Not on file  . Years of education: Not on file  . Highest education level: Not on file  Occupational History  . Not on file  Social Needs  . Financial resource strain: Not on file  . Food insecurity:    Worry: Not on file    Inability: Not on file  . Transportation needs:    Medical: Not on file    Non-medical: Not on file  Tobacco Use  . Smoking status: Never Smoker  . Smokeless tobacco: Never Used  Substance and Sexual Activity  . Alcohol use: No  . Drug use: Not Currently  . Sexual activity: Not on file  Lifestyle  . Physical activity:    Days per week: Not on file    Minutes per session: Not on file  . Stress: Not on file  Relationships  . Social connections:    Talks on phone: Not on file    Gets together: Not on file    Attends religious service: Not on file    Active member of club or organization: Not on file    Attends meetings of clubs or organizations: Not on file    Relationship status: Not on file  Other Topics Concern  . Not on file  Social History Narrative   . Not on file   Family History  Problem Relation Age of Onset  . Rheum arthritis Mother   . Hypertension Mother   . Atrial fibrillation Father    No Known Allergies Prior to Admission medications   Medication Sig Start Date End Date Taking? Authorizing Provider  HYDROcodone-acetaminophen (NORCO) 5-325 MG tablet Take 1 tablet by mouth every 4 (four) hours as needed for severe pain. 04/03/18  Yes Loren Racer, MD  ibuprofen (ADVIL,MOTRIN) 200 MG tablet Take 200 mg by mouth every 6 (six) hours as needed (Takes 3 200mg  as needed).   Yes [provider]   No results found.  Positive ROS: All other systems have been reviewed and were otherwise negative with the exception of those mentioned in the HPI and as above.  Physical Exam: General: Alert, no acute distress Cardiovascular: No pedal edema Respiratory: No cyanosis, no use of accessory musculature GI: No organomegaly, abdomen is soft and non-tender Skin: No lesions in the area of chief complaint Neurologic: Sensation intact distally Psychiatric: Patient is competent for consent with normal mood and affect Lymphatic: No axillary or cervical lymphadenopathy    Assessment: Left ankle bimalleolar equivalent fracture  Plan: -Plan for open reduction and internal fixation of the  left ankle today.  We discussed again the risk benefits and indications of this procedure.  All questions were solicited and answered to her satisfaction.  She has provided informed consent to proceed. -She will be nonweightbearing postoperatively to the left lower extremity. - The risks, benefits, and alternatives were discussed with the patient. There are risks associated with the surgery including, but not limited to, problems with anesthesia (death), infection, differences in leg length/angulation/rotation, fracture of bones, loosening or failure of implants, malunion, nonunion, hematoma (blood accumulation) which may require surgical drainage,  blood clots, pulmonary embolism, nerve injury (foot drop), and blood vessel injury. The patient understands these risks and elects to proceed. -We will plan for discharge home from PACU postoperatively.    Yolonda KidaJason Patrick Alyze Lauf, MD Cell [phone]    04/15/2018 1:01 PM

## 2018-04-15 NOTE — Discharge Instructions (Addendum)
Nonweightbearing to left lower extremity -Elevate left lower extremity with "toes above nose." -Maintain splint clean and dry at all times. -For mild to moderate pain take Tylenol and/or Advil.  For breakthrough or severe pain use oxycodone as directed. -For the prevention of blood clots take an 81 mg aspirin twice daily for 6 weeks. -Return to see Dr. Aundria Rudogers in 2 weeks for wound check.

## 2018-04-20 ENCOUNTER — Encounter (HOSPITAL_COMMUNITY): Payer: Self-pay | Admitting: Orthopedic Surgery

## 2020-02-07 IMAGING — RF DG C-ARM 61-120 MIN
1 series · 4 of 4 positions shown · non-contrast
Comparison: 04/03/2018

CLINICAL DATA: ORIF left ankle fracture

EXAM:
LEFT ANKLE - 2 VIEW; DG C-ARM 61-120 MIN

[Series 1: run · 4 of 4 slices shown]
[im 1/4]
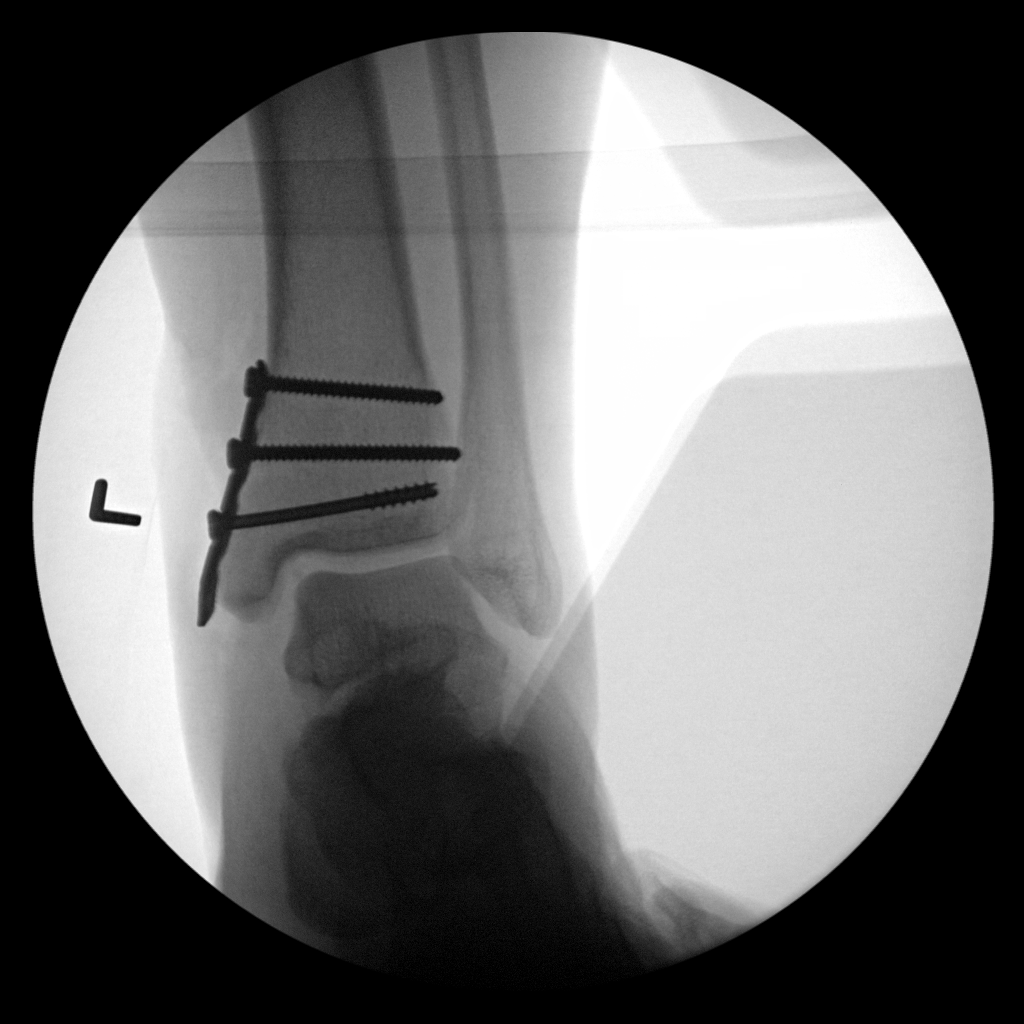
[im 2/4]
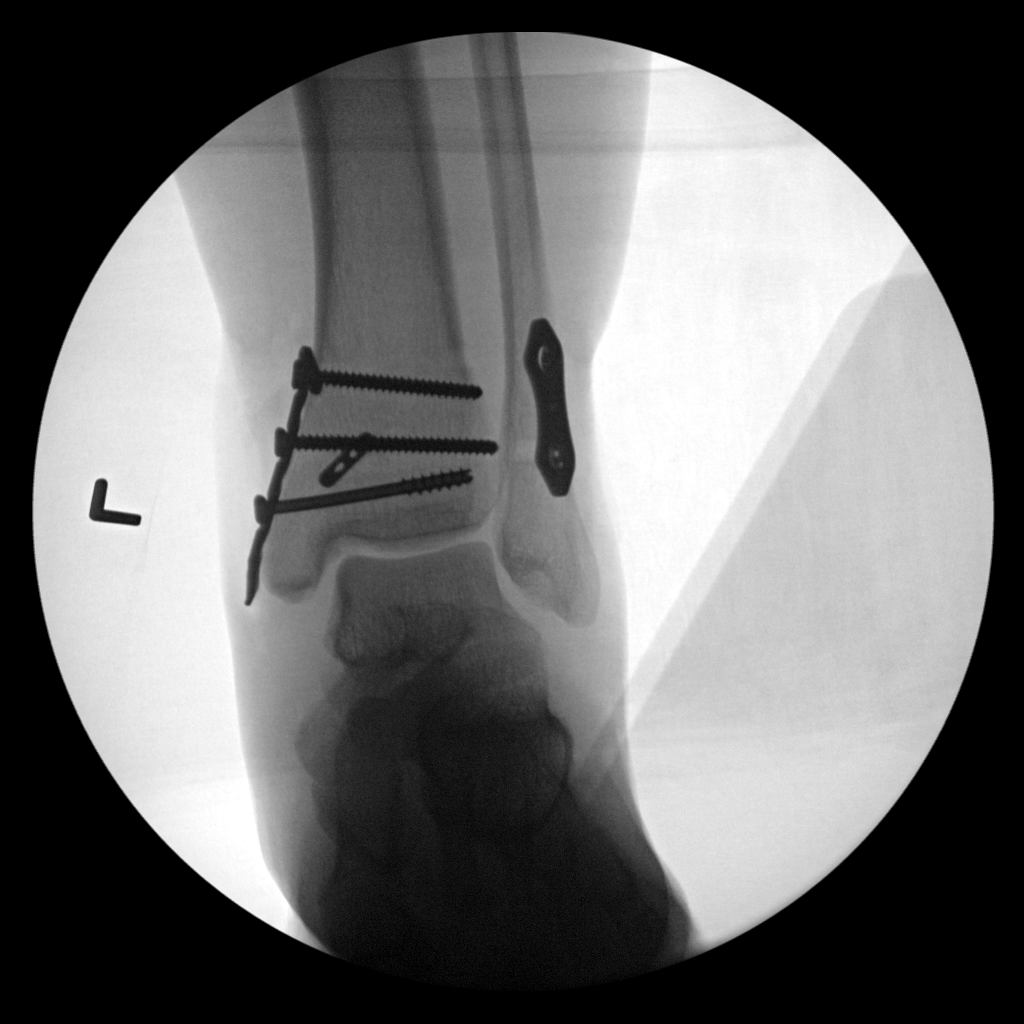
[im 3/4]
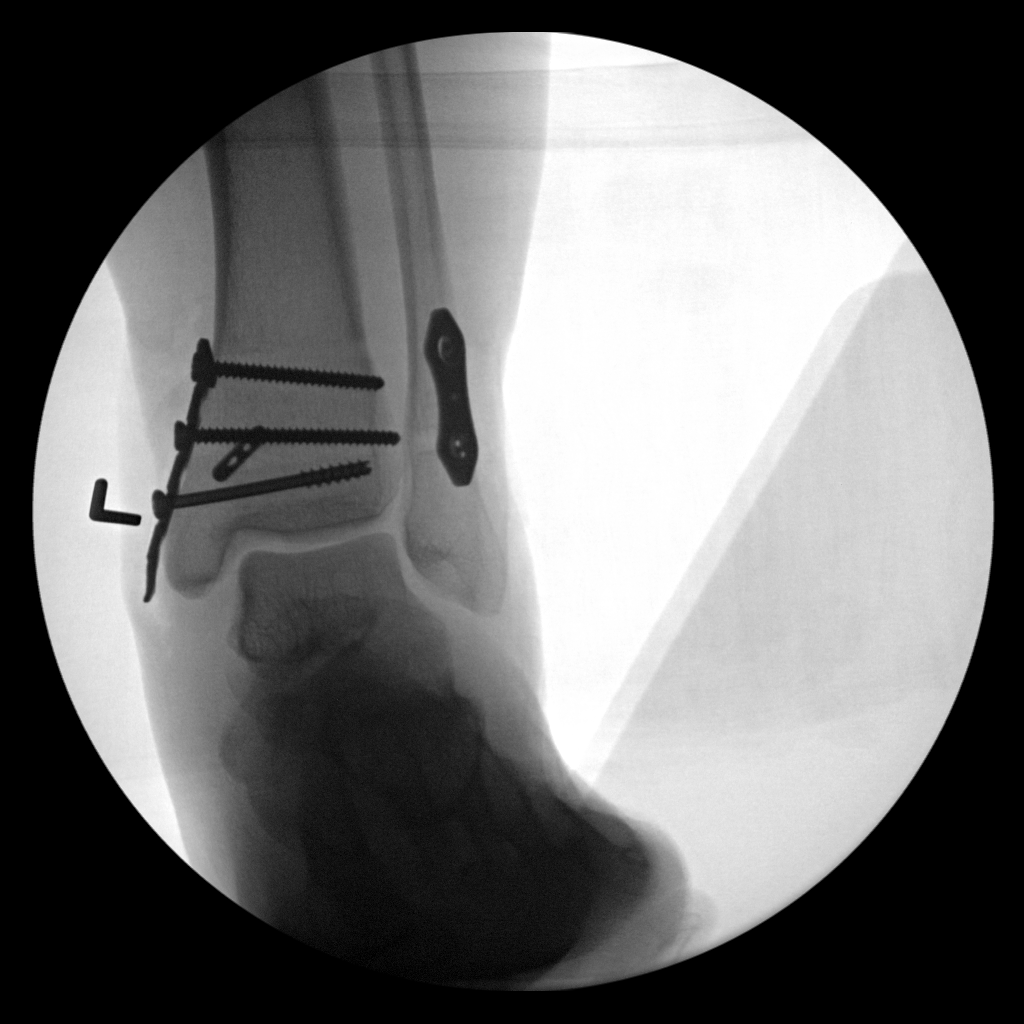
[im 4/4]
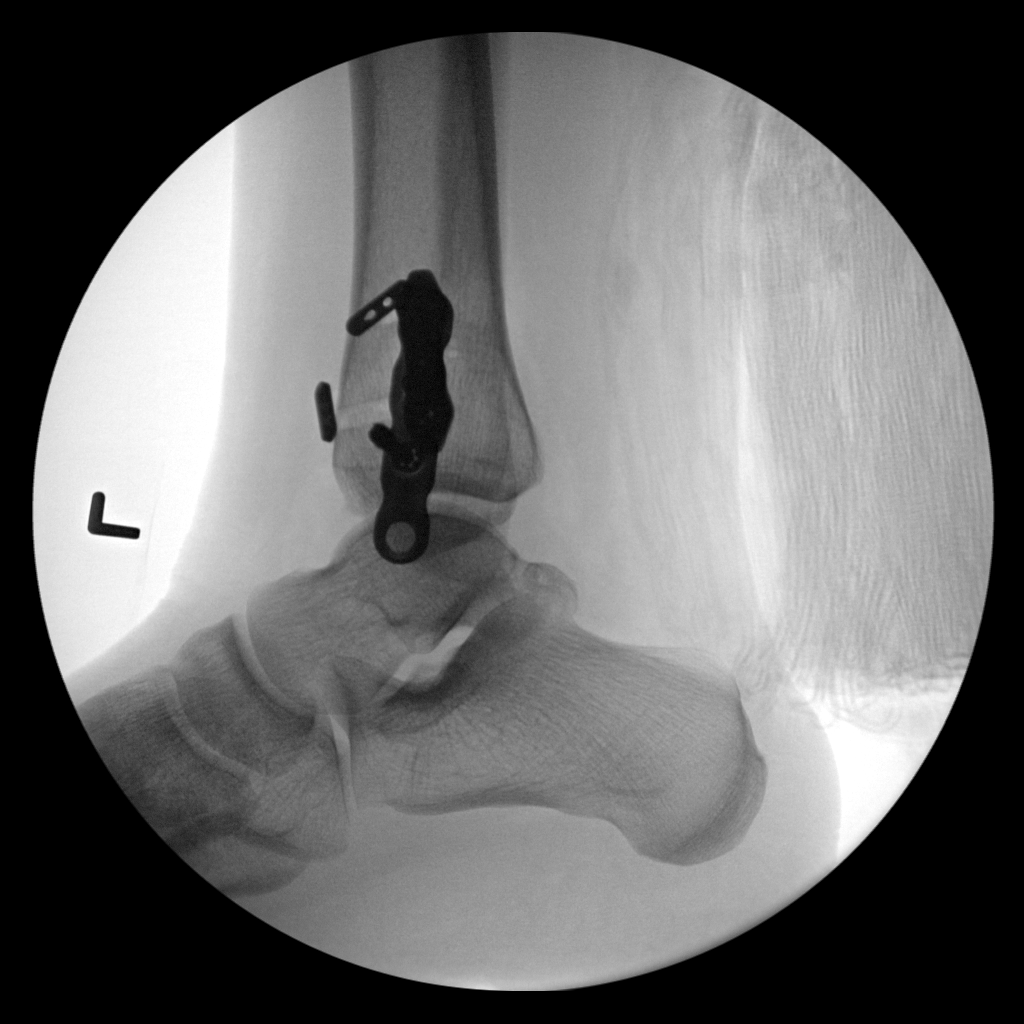

[4 of 4 positions shown; findings below may reference images not displayed]

FLUOROSCOPY TIME:  Fluoroscopy Time:  30 seconds

Radiation Exposure Index (if provided by the fluoroscopic device):
Not available

Number of Acquired Spot Images: 4
FINDINGS: Fixation sideplate is noted along the distal tibia with 3 fixation
screws in place. Subsequent fixator device is noted traversing both
the distal tibia and distal fibula. The previously seen fracture
dislocation has been reduced.
IMPRESSION: ORIF of left ankle fracture dislocation.

## 2022-08-14 ENCOUNTER — Ambulatory Visit
Admission: EM | Admit: 2022-08-14 | Discharge: 2022-08-14 | Disposition: A | Discharge status: Home / Self Care | Payer: BC Managed Care – PPO | Attending: Physician Assistant | Admitting: Physician Assistant

## 2022-08-14 DIAGNOSIS — J069 Acute upper respiratory infection, unspecified: Secondary | ICD-10-CM | POA: Insufficient documentation

## 2022-08-14 DIAGNOSIS — Z1152 Encounter for screening for COVID-19: Secondary | ICD-10-CM | POA: Diagnosis not present

## 2022-08-14 LAB — RESP PANEL BY RT-PCR (FLU A&B, COVID) ARPGX2
Influenza A by PCR: NEGATIVE
Influenza B by PCR: NEGATIVE
SARS Coronavirus 2 by RT PCR: NEGATIVE

## 2022-08-14 NOTE — ED Triage Notes (Signed)
Pt c/o sore throat, headache, cough, nasal drainage, fever at home onset ~ 3 days ago.

## 2022-08-14 NOTE — ED Provider Notes (Signed)
[facility]    CSN: [id number] Arrival date & time: 08/14/22  0808      History   Chief Complaint Chief Complaint  Patient presents with   Cough    HPI FORESTINE MACHO is a 31 y.o. female.   Patient here today for evaluation of nasal congestion, cough, fever that started about 3 days ago.  She reports Tmax of 100 Fahrenheit.  She denies any nausea or vomiting.  She has not had any diarrhea.  She has had some sore throat.  She has tried DayQuil and NyQuil with mild relief.  The history is provided by the patient.    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      Home Medications    Prior to Admission medications   Medication Sig Start Date End Date Taking? Authorizing Provider  ibuprofen (ADVIL,MOTRIN) 200 MG tablet Take 200 mg by mouth every 6 (six) hours as needed (Takes 3 200mg  as needed).    [provider]  ondansetron (ZOFRAN ODT) 4 MG disintegrating tablet Take 1 tablet (4 mg total) by mouth every 8 (eight) hours as needed for nausea or vomiting. 04/15/18   06/15/18, MD    Family History Family History  Problem Relation Age of Onset   Rheum arthritis Mother    Hypertension Mother    Atrial  fibrillation Father     Social History Social History   Tobacco Use   Smoking status: Never   Smokeless tobacco: Never  Vaping Use   Vaping Use: Former  Substance Use Topics   Alcohol use: No   Drug use: Not Currently     Allergies   Patient has no known allergies.   Review of Systems Review of Systems  Constitutional:  Positive for chills and fever.  HENT:  Positive for congestion and sore throat. Negative for ear pain.   Eyes:  Negative for discharge and redness.  Respiratory:  Positive for cough. Negative for shortness of breath and wheezing.   Gastrointestinal:  Negative for abdominal pain, diarrhea, nausea and vomiting.     Physical Exam Triage Vital Signs ED Triage Vitals  Enc Vitals Group     BP      Pulse      Resp      Temp      Temp src      SpO2      Weight      Height      Head Circumference      Peak Flow      Pain Score      Pain Loc      Pain Edu?      Excl. in GC?    No data found.  Updated Vital Signs BP (!) 142/90 (BP Location: Left Arm)   Pulse 90   Temp 98.2 F (36.8 C) (Oral)   Resp 16   SpO2 96%      Physical Exam Vitals and nursing note reviewed.  Constitutional:      General: She is not in acute distress.    Appearance: Normal appearance. She is not ill-appearing.  HENT:     Head: Normocephalic and atraumatic.     Right Ear: Tympanic membrane normal.     Left Ear: Tympanic membrane normal.     Nose: Congestion present.     Mouth/Throat:     Mouth: Mucous membranes are moist.     Pharynx: No oropharyngeal exudate or posterior oropharyngeal erythema.  Eyes:     Conjunctiva/sclera: Conjunctivae normal.  Cardiovascular:     Rate and Rhythm: Normal rate and regular rhythm.     Heart sounds: Normal heart sounds. No murmur heard. Pulmonary:     Effort: Pulmonary effort is normal. No respiratory distress.     Breath sounds: Normal breath sounds. No wheezing, rhonchi or rales.  Skin:    General: Skin is warm and dry.   Neurological:     Mental Status: She is alert.  Psychiatric:        Mood and Affect: Mood normal.        Thought Content: Thought content normal.      UC Treatments / Results  Labs (all labs ordered are listed, but only abnormal results are displayed) Labs Reviewed  RESP PANEL BY RT-PCR (FLU A&B, COVID) [id number]    EKG   Radiology No results found.  Procedures Procedures (including critical care time)  Medications Ordered in UC Medications - No data to display  Initial Impression / Assessment and Plan / UC Course  I have reviewed the triage vital signs and the nursing notes.  Pertinent labs & imaging results that were available during my care of the patient were reviewed by me and considered in my medical decision making (see chart for details).    Suspect viral etiology of symptoms.  Will screen for COVID and flu.  Recommended symptomatic treatment at home with increase fluids and follow-up with any further concerns or worsening symptoms.  Patient expresses understanding.  Final Clinical Impressions(s) / UC Diagnoses   Final diagnoses:  Acute upper respiratory infection  Encounter for screening for COVID-19   Discharge Instructions   None    ED Prescriptions   None    PDMP not reviewed this encounter.   Tomi Bamberger, PA-C 08/14/22 [phone]
